# Patient Record
Sex: Male | Born: 1975 | Race: Black or African American | Hispanic: No | Marital: Single | Smoking: Current every day smoker
Health system: Southern US, Community
[De-identification: ages and names within clinical notes are randomized; demographics above are authoritative.]

---

## 2015-01-31 ENCOUNTER — Emergency Department (HOSPITAL_BASED_OUTPATIENT_CLINIC_OR_DEPARTMENT_OTHER): Payer: Self-pay

## 2015-01-31 ENCOUNTER — Emergency Department (HOSPITAL_BASED_OUTPATIENT_CLINIC_OR_DEPARTMENT_OTHER)
Admission: EM | Admit: 2015-01-31 | Discharge: 2015-01-31 | Disposition: A | Discharge status: Home / Self Care | Payer: Self-pay | Attending: Emergency Medicine | Admitting: Emergency Medicine

## 2015-01-31 ENCOUNTER — Encounter (HOSPITAL_BASED_OUTPATIENT_CLINIC_OR_DEPARTMENT_OTHER): Payer: Self-pay | Admitting: *Deleted

## 2015-01-31 DIAGNOSIS — M79641 Pain in right hand: Secondary | ICD-10-CM

## 2015-01-31 DIAGNOSIS — Z720 Tobacco use: Secondary | ICD-10-CM | POA: Insufficient documentation

## 2015-01-31 DIAGNOSIS — S6991XA Unspecified injury of right wrist, hand and finger(s), initial encounter: Secondary | ICD-10-CM | POA: Insufficient documentation

## 2015-01-31 DIAGNOSIS — Y9389 Activity, other specified: Secondary | ICD-10-CM | POA: Insufficient documentation

## 2015-01-31 DIAGNOSIS — W108XXA Fall (on) (from) other stairs and steps, initial encounter: Secondary | ICD-10-CM | POA: Insufficient documentation

## 2015-01-31 DIAGNOSIS — Y9289 Other specified places as the place of occurrence of the external cause: Secondary | ICD-10-CM | POA: Insufficient documentation

## 2015-01-31 DIAGNOSIS — Y998 Other external cause status: Secondary | ICD-10-CM | POA: Insufficient documentation

## 2015-01-31 MED ORDER — TRAMADOL HCL 50 MG PO TABS: 50.0000 mg | ORAL_TABLET | Freq: Four times a day (QID) | ORAL | Status: AC | PRN

## 2015-01-31 MED ORDER — HYDROCODONE-ACETAMINOPHEN 5-325 MG PO TABS
1.0000 | ORAL_TABLET | Freq: Once | ORAL | Status: AC
  Administered 2015-01-31: 1 via ORAL
  Filled 2015-01-31: qty 1

## 2015-01-31 NOTE — ED Provider Notes (Signed)
CSN: [id number]     Arrival date & time 01/31/15  1341 History   First MD Initiated Contact with Patient 01/31/15 1412     Chief Complaint  Patient presents with  . Fall  . Arm Injury   He fell forward this morning on his outstretched right hand.  He denies any LOC and tripped over his sandal.  Reports pain 10/10 that is achy, throbbing and localized. He denies any prior injury. Having some decreased sensation in the tips of his fingers in the right hand. Is unable to move his wrist in flexion or extension 2/2 to pain.    (Consider location/radiation/quality/duration/timing/severity/associated sxs/prior Treatment) The history is provided by the patient.    History reviewed. No pertinent past medical history. History reviewed. No pertinent past surgical history. No family history on file. History  Substance Use Topics  . Smoking status: Current Every Day Smoker -- 0.50 packs/day    Types: Cigarettes  . Smokeless tobacco: Not on file  . Alcohol Use: No    Review of Systems  Skin: Negative for rash and wound.      Allergies  Review of patient's allergies indicates no known allergies.  Home Medications   Prior to Admission medications   Not on File   BP 123/63 mmHg  Pulse 81  Temp(Src) 98.3 F (36.8 C) (Oral)  Resp 20  Ht 5\' 9"  (1.753 m)  Wt 175 lb (79.379 kg)  BMI 25.83 kg/m2  SpO2 99% Physical Exam  Constitutional: He appears well-developed and well-nourished.  HENT:  Head: Normocephalic and atraumatic.  Eyes: Conjunctivae and EOM are normal.  Neck: Normal range of motion.  Cardiovascular: Normal rate.   Pulmonary/Chest: Effort normal.  Musculoskeletal:  Right hand: no ecchymosis or erythema Swollen compared to left Generalized TTP on dorsal aspect of wrist  Limited ROM in wrist flexion and extension  Unable to test radial or ulnar deviation  Limited movement of digits 2/2 to pain  Thumb opposition intact  Sensation intact in distal digits.   Neurovascularly intact  No pain upon palpation of his right elbow.  Unable to supinate 2/2 to pain     ED Course  Procedures (including critical care time) Labs Review Labs Reviewed - No data to display  SPLINT APPLICATION Date/Time: 4:03 PM Authorized by: Clare GandyJeremy Schmitz Consent: Verbal consent obtained. Risks and benefits: risks, benefits and alternatives were discussed Consent given by: patient Splint applied by: orthopedic technician Location details: lateral right wrist Splint type: short arm  Supplies used: velcro Post-procedure: The splinted body part was neurovascularly unchanged following the procedure. Patient tolerance: Patient tolerated the procedure well with no immediate complications.   Imaging Review Dg Hand 2 View Left  01/31/2015   CLINICAL DATA:  Left hand for comparison view.  Right  EXAM: LEFT HAND - 2 VIEW  COMPARISON:  None.  FINDINGS: There is no evidence of fracture or dislocation. There is no evidence of arthropathy or other focal bone abnormality. Soft tissues are unremarkable.  IMPRESSION: No acute abnormality is noted. The broadened appearance of right first metacarpal is again likely posttraumatic in nature. No acute abnormality is noted on the left.   Electronically Signed   By: Alcide CleverMark  Lukens M.D.   On: 01/31/2015 15:25   Dg Hand Complete Right  01/31/2015   CLINICAL DATA:  Fall down steps 1 hour ago with right hand pain and swelling. Initial encounter.  EXAM: RIGHT HAND - COMPLETE 3+ VIEW  COMPARISON:  None.  FINDINGS: No evidence of  acute fracture or dislocation.  Broad appearance of the proximal first metacarpal is likely from remote and healed fracture.  Degenerative spurring at the distal pole of the scaphoid which could be posttraumatic given the above.  IMPRESSION: No acute osseous findings.   Electronically Signed   By: Marnee Spring M.D.   On: 01/31/2015 14:07     EKG Interpretation None     Medications  HYDROcodone-acetaminophen  (NORCO/VICODIN) 5-325 MG per tablet 1 tablet (1 tablet Oral Given 01/31/15 1458)    MDM   Final diagnoses:  Right hand pain   Pain 2/2 to fall. Imaging to reveal of any fracture. Suspect that edema is 2/2 to his trauma. May have TFCC injury but cannot based this on his imaging. Will place in a short arm splint. Script of tramadol for pain. Given information about follow up with hand specialist. Discharge patient and is agreeable.   Myra Rude, MD PGY-2, [facility] 01/31/2015, 3:54 PM      Myra Rude, MD 01/31/15 1610  Toy Cookey, MD 02/01/15 [phone]

## 2015-01-31 NOTE — Discharge Instructions (Signed)
RICE: Routine Care for Injuries The routine care of many injuries includes Rest, Ice, Compression, and Elevation (RICE). HOME CARE INSTRUCTIONS  Rest is needed to allow your body to heal. Routine activities can usually be resumed when comfortable. Injured tendons and bones can take up to 6 weeks to heal. Tendons are the cord-like structures that attach muscle to bone.  Ice following an injury helps keep the swelling down and reduces pain.  Put ice in a plastic bag.  Place a towel between your skin and the bag.  Leave the ice on for 15-20 minutes, 3-4 times a day, or as directed by your health care provider. Do this while awake, for the first 24 to 48 hours. After that, continue as directed by your caregiver.  Compression helps keep swelling down. It also gives support and helps with discomfort. If an elastic bandage has been applied, it should be removed and reapplied every 3 to 4 hours. It should not be applied tightly, but firmly enough to keep swelling down. Watch fingers or toes for swelling, bluish discoloration, coldness, numbness, or excessive pain. If any of these problems occur, remove the bandage and reapply loosely. Contact your caregiver if these problems continue.  Elevation helps reduce swelling and decreases pain. With extremities, such as the arms, hands, legs, and feet, the injured area should be placed near or above the level of the heart, if possible. SEEK IMMEDIATE MEDICAL CARE IF:  You have persistent pain and swelling.  You develop redness, numbness, or unexpected weakness.  Your symptoms are getting worse rather than improving after several days. These symptoms may indicate that further evaluation or further X-rays are needed. Sometimes, X-rays may not show a small broken bone (fracture) until 1 week or 10 days later. Make a follow-up appointment with your caregiver. Ask when your X-ray results will be ready. Make sure you get your X-ray results. Document Released:  11/25/2000 Document Revised: 08/18/2013 Document Reviewed: 01/12/2011 ExitCare Patient Information 2015 [employer]. This information is not intended to replace advice given to you by your health care provider. Make sure you discuss any questions you have with your health care provider.  

## 2015-01-31 NOTE — ED Notes (Signed)
Fell down 4 steps. Injury to his right hand. Swelling to his thumb.

## 2016-05-19 IMAGING — CR DG HAND COMPLETE 3+V*R*
3 series · 3 of 3 positions shown · non-contrast
Comparison: None.

CLINICAL DATA: Fall down steps 1 hour ago with right hand pain and
swelling. Initial encounter.

EXAM:
RIGHT HAND - COMPLETE 3+ VIEW

[x hand pa right]
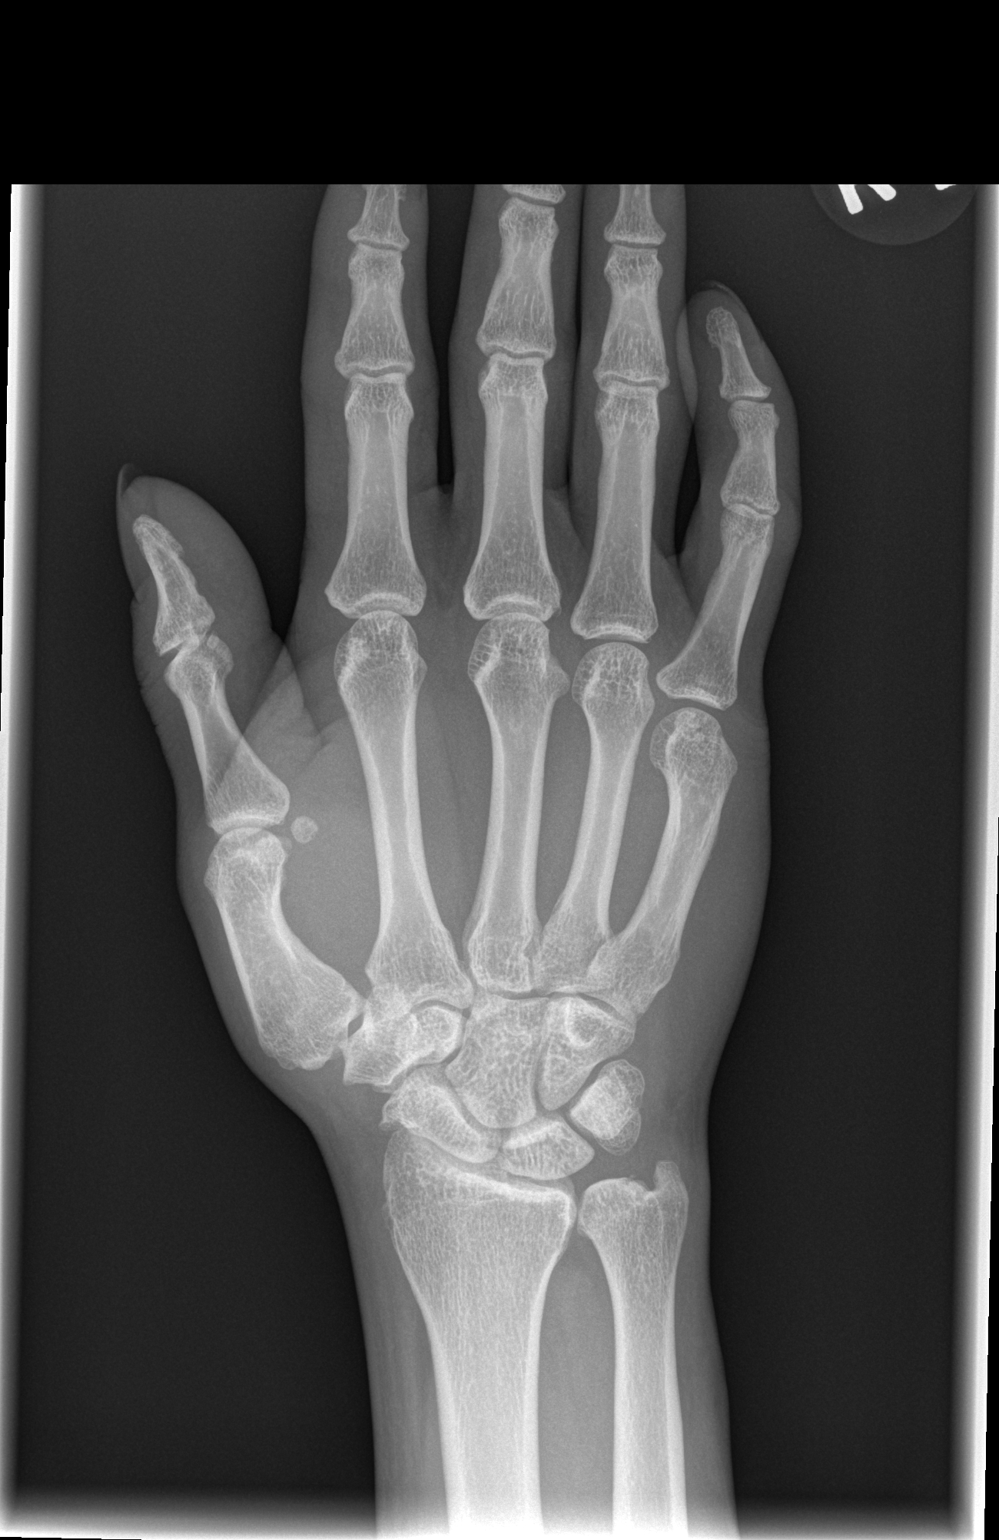

[x hand oblique right]
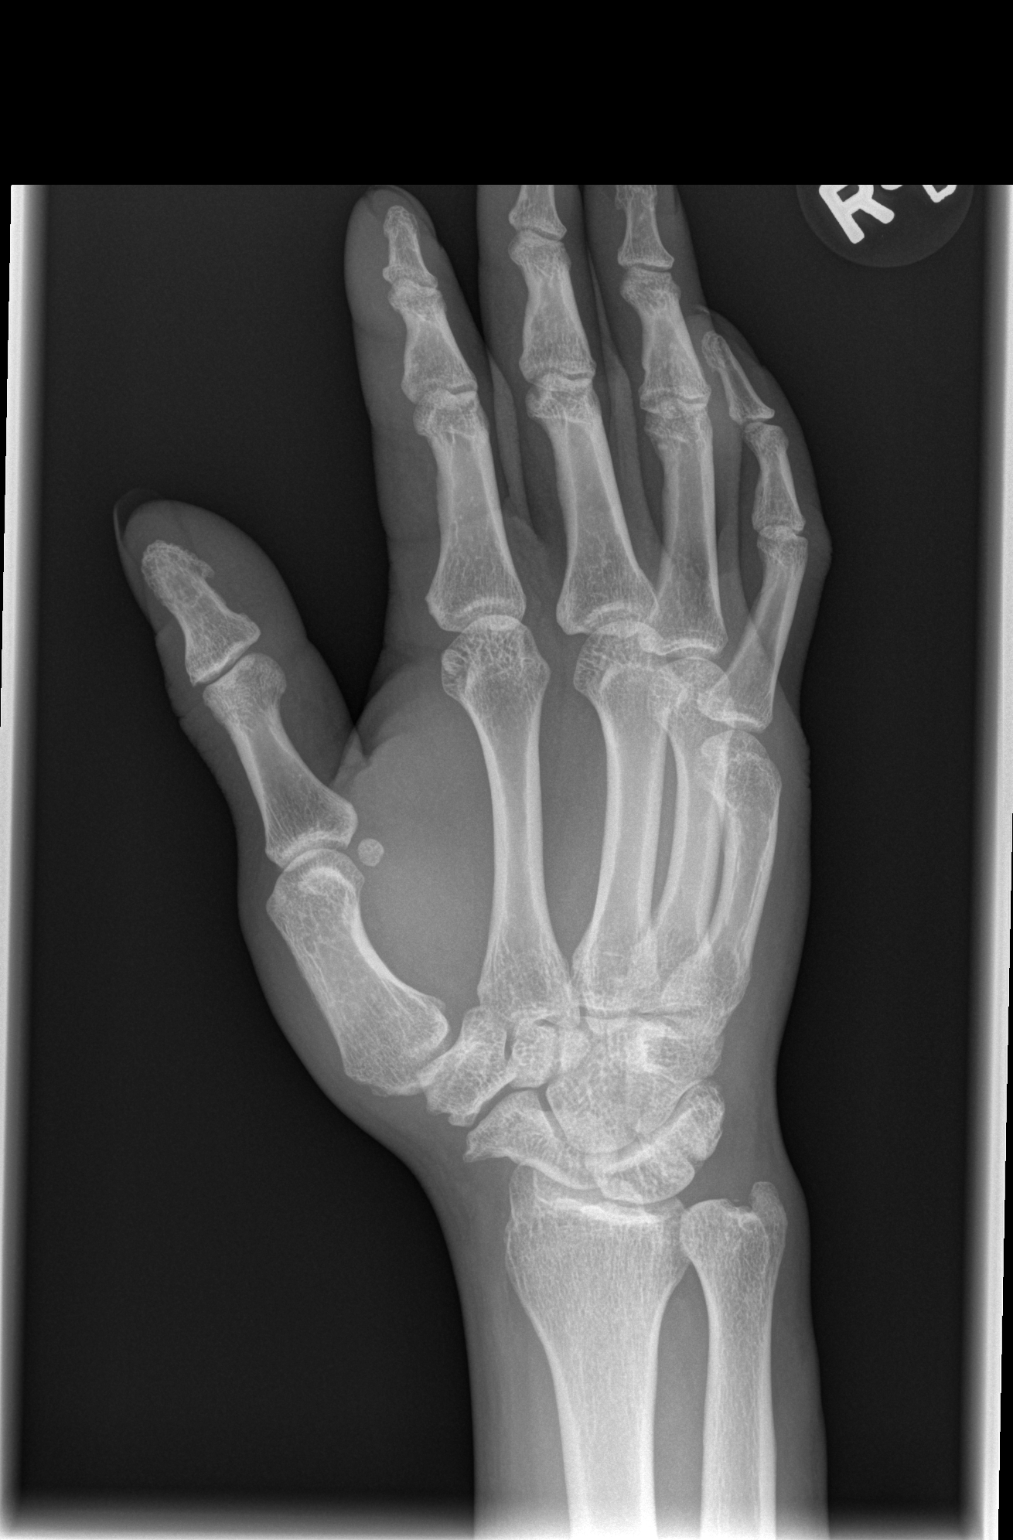

[x hand lat right]
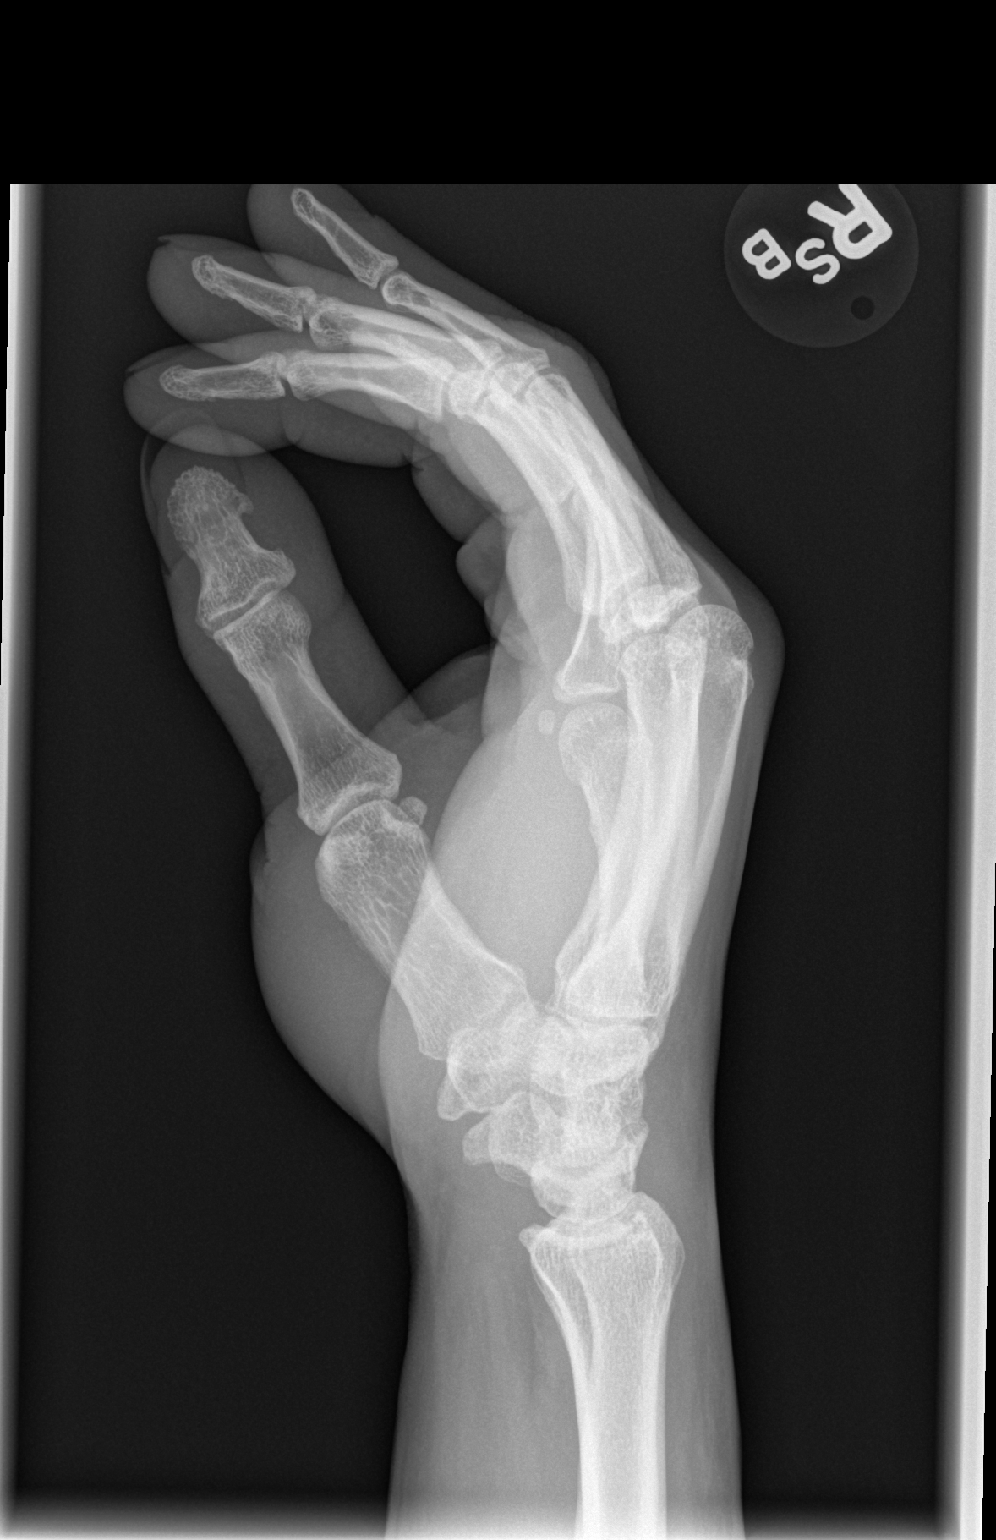

[3 of 3 positions shown; findings below may reference images not displayed]

FINDINGS: No evidence of acute fracture or dislocation.

Broad appearance of the proximal first metacarpal is likely from
remote and healed fracture.

Degenerative spurring at the distal pole of the scaphoid which could
be posttraumatic given the above.
IMPRESSION: No acute osseous findings.

## 2021-01-04 ENCOUNTER — Emergency Department (HOSPITAL_BASED_OUTPATIENT_CLINIC_OR_DEPARTMENT_OTHER)
Admission: EM | Admit: 2021-01-04 | Discharge: 2021-01-04 | Disposition: A | Discharge status: Home / Self Care | Payer: Self-pay | Attending: Emergency Medicine | Admitting: Emergency Medicine

## 2021-01-04 ENCOUNTER — Other Ambulatory Visit: Payer: Self-pay

## 2021-01-04 ENCOUNTER — Encounter (HOSPITAL_BASED_OUTPATIENT_CLINIC_OR_DEPARTMENT_OTHER): Payer: Self-pay

## 2021-01-04 ENCOUNTER — Emergency Department (HOSPITAL_BASED_OUTPATIENT_CLINIC_OR_DEPARTMENT_OTHER): Payer: Self-pay

## 2021-01-04 DIAGNOSIS — U071 COVID-19: Secondary | ICD-10-CM | POA: Insufficient documentation

## 2021-01-04 DIAGNOSIS — F17210 Nicotine dependence, cigarettes, uncomplicated: Secondary | ICD-10-CM | POA: Insufficient documentation

## 2021-01-04 LAB — RESP PANEL BY RT-PCR (FLU A&B, COVID) ARPGX2
Influenza A by PCR: NEGATIVE
Influenza B by PCR: NEGATIVE
SARS Coronavirus 2 by RT PCR: POSITIVE — AB

## 2021-01-04 MED ORDER — ACETAMINOPHEN 500 MG PO TABS
1000.0000 mg | ORAL_TABLET | Freq: Once | ORAL | Status: AC
  Administered 2021-01-04: 1000 mg via ORAL
  Filled 2021-01-04: qty 2

## 2021-01-04 MED ORDER — IBUPROFEN 800 MG PO TABS
800.0000 mg | ORAL_TABLET | Freq: Once | ORAL | Status: AC
  Administered 2021-01-04: 800 mg via ORAL
  Filled 2021-01-04: qty 1

## 2021-01-04 MED ORDER — BENZONATATE 100 MG PO CAPS: 100.0000 mg | ORAL_CAPSULE | Freq: Three times a day (TID) | ORAL | 0 refills | Status: AC | PRN

## 2021-01-04 MED ORDER — FLUTICASONE PROPIONATE 50 MCG/ACT NA SUSP: 2.0000 | Freq: Every day | NASAL | 0 refills | Status: AC

## 2021-01-04 MED ORDER — ALBUTEROL SULFATE HFA 108 (90 BASE) MCG/ACT IN AERS: 1.0000 | INHALATION_SPRAY | Freq: Four times a day (QID) | RESPIRATORY_TRACT | 0 refills | Status: AC | PRN

## 2021-01-04 NOTE — ED Provider Notes (Signed)
Emergency Department Provider Note   I have reviewed the triage vital signs and the nursing notes.   HISTORY  Chief Complaint Cough   HPI Marcus Mccoy is a 45 y.o. male with past medical history reviewed below presents with headache, body aches, cough, sore throat, loss of smell.  Symptoms have been present since yesterday afternoon.  He is currently staying in a homeless shelter and states its not uncommon for others at the shelter to have various infection type symptoms.  No known COVID infection/contact.  He is not vaccinated.  He is not having chest pain.  He is not having swelling in the legs.  No radiation of symptoms or other modifying factors.   History reviewed. No pertinent past medical history.  There are no problems to display for this patient.   History reviewed. No pertinent surgical history.  Allergies Patient has no known allergies.  No family history on file.  Social History Social History   Tobacco Use  . Smoking status: Current Every Day Smoker    Packs/day: 0.50    Types: Cigarettes  . Smokeless tobacco: Never Used  Substance Use Topics  . Alcohol use: No  . Drug use: No    Review of Systems  Constitutional: Subjective fever and body aches.  Eyes: No visual changes. ENT: Positive sore throat. Cardiovascular: Denies chest pain. Respiratory: Denies shortness of breath. Positive cough.  Gastrointestinal: No abdominal pain.  No nausea, no vomiting.  No diarrhea.  No constipation. Genitourinary: Negative for dysuria. Musculoskeletal: Negative for back pain. Skin: Negative for rash. Neurological: Negative for focal weakness or numbness. Positive HA.   10-point ROS otherwise negative.  ____________________________________________   PHYSICAL EXAM:  VITAL SIGNS: ED Triage Vitals  Enc Vitals Group     BP 01/04/21 1221 (!) 151/97     Pulse Rate 01/04/21 1221 (!) 116     Resp 01/04/21 1221 20     Temp 01/04/21 1221 (!) 102.7 F (39.3  C)     Temp Source 01/04/21 1221 Oral     SpO2 01/04/21 1221 98 %     Weight 01/04/21 1221 186 lb (84.4 kg)     Height 01/04/21 1221 5\' 8"  (1.727 m)   Constitutional: Alert and oriented. Well appearing and in no acute distress. Eyes: Conjunctivae are normal.  Head: Atraumatic. Nose: Mild congestion/rhinnorhea. Mouth/Throat: Mucous membranes are moist.  Oropharynx with mild erythema. No PTA. No exudate. Uvula is midline.  Neck: No stridor.  No meningeal signs. Cardiovascular: Tachycardia. Good peripheral circulation. Grossly normal heart sounds.   Respiratory: Normal respiratory effort.  No retractions. Lungs CTAB. Gastrointestinal: Soft and nontender. No distention.  Musculoskeletal:  No gross deformities of extremities. Neurologic:  Normal speech and language.  Skin:  Skin is warm, dry and intact. No rash noted.   ____________________________________________   LABS (all labs ordered are listed, but only abnormal results are displayed)  Labs Reviewed  RESP PANEL BY RT-PCR (FLU A&B, COVID) ARPGX2 - Abnormal; Notable for the following components:      Result Value   SARS Coronavirus 2 by RT PCR POSITIVE (*)    All other components within normal limits   ____________________________________________  RADIOLOGY  DG Chest Portable 1 View  Result Date: 01/04/2021 CLINICAL DATA:  Cough and fevers for 2 days EXAM: PORTABLE CHEST 1 VIEW COMPARISON:  07/26/2017 FINDINGS: Cardiac shadow is stable. The lungs are well aerated bilaterally. Mild left basilar atelectasis is seen. No bony abnormality is noted. IMPRESSION: Mild left basilar  atelectasis. Electronically Signed   By: Alcide Clever M.D.   On: 01/04/2021 15:40    ____________________________________________   PROCEDURES  Procedure(s) performed:   Procedures  None  ____________________________________________   INITIAL IMPRESSION / ASSESSMENT AND PLAN / ED COURSE  Pertinent labs & imaging results that were available  during my care of the patient were reviewed by me and considered in my medical decision making (see chart for details).   Patient presents to the emergency department with flulike symptoms starting yesterday.  He is not having chest pain, shortness of breath.  He is having cough with body aches mainly.  He is not hypoxic, tachypneic, tachycardic.  Doubt PE.  Abdomen is soft and nontender.  No SIRS vitals. Plan for COVID/Flu swab and CXR.  CXR clear. Flu negative but COVID is positive. Do not see an indication for admit. Will discuss with SW regarding community resources with patient's homeless status.   06:05 PM  Called back to patient's room.  He states that he needs to leave.  We discussed his COVID-positive test results and I suspect he would not be invited back to the shelter in this situation.  He states that he does work and will need a work note.  He can call friends/family to see if he may be able to stay with them.  I have a consult out to the social worker asking for assistance.  He understands this but would not like to wait for the return call and states he needs to go now.  We discussed isolation along with return precautions.  Provided supportive care medications here.   Marcus Mccoy was evaluated in Emergency Department on 01/04/2021 for the symptoms described in the history of present illness. He was evaluated in the context of the global COVID-19 pandemic, which necessitated consideration that the patient might be at risk for infection with the SARS-CoV-2 virus that causes COVID-19. Institutional protocols and algorithms that pertain to the evaluation of patients at risk for COVID-19 are in a state of rapid change based on information released by regulatory bodies including the CDC and federal and state organizations. These policies and algorithms were followed during the patient's care in the ED.  ____________________________________________  FINAL CLINICAL IMPRESSION(S) / ED  DIAGNOSES  Final diagnoses:  COVID-19     MEDICATIONS GIVEN DURING THIS VISIT:  Medications  ibuprofen (ADVIL) tablet 800 mg (800 mg Oral Given 01/04/21 1226)  acetaminophen (TYLENOL) tablet 1,000 mg (1,000 mg Oral Given 01/04/21 1747)     NEW OUTPATIENT MEDICATIONS STARTED DURING THIS VISIT:  New Prescriptions   ALBUTEROL (VENTOLIN HFA) 108 (90 BASE) MCG/ACT INHALER    Inhale 1-2 puffs into the lungs every 6 (six) hours as needed for wheezing or shortness of breath.   BENZONATATE (TESSALON) 100 MG CAPSULE    Take 1 capsule (100 mg total) by mouth 3 (three) times daily as needed for cough.   FLUTICASONE (FLONASE) 50 MCG/ACT NASAL SPRAY    Place 2 sprays into both nostrils daily for 7 days.    Note:  This document was prepared using Dragon voice recognition software and may include unintentional dictation errors.  Alona Bene, MD, [facility] Emergency Medicine    Kennis Wissmann, Arlyss Repress, MD 01/04/21 [phone]

## 2021-01-04 NOTE — Discharge Instructions (Signed)
You were seen in the emergency department today with flulike symptoms.  Your test for COVID has come back positive.  I have provided several prescriptions to help with symptoms.  If you develop shortness of breath, chest pain, confusion you should return to the emergency department.  You can alternate Tylenol and Motrin for fever and headache/body aches.

## 2021-01-04 NOTE — ED Triage Notes (Signed)
Pt c/o flu like sx started yesterday-NAD-steady gait 

## 2021-01-05 NOTE — Progress Notes (Signed)
01/05/2021 248 pm    [facility] Medication Assistance Card  Name: Marcus Mccoy  ID (MRN): [id number] Bin: [id number] RX Group: [id number]  Discharge Date:01/04/2021  6:14 PM  Expiration Date:01/13/2021  (must be filled within 7 days of discharge)     Dear Marcus Mccoy:  You have been approved to have the prescriptions written by your discharging physician filled through our [facility] (Medication Assistance Through [facility]) program. This program allows for a one-time (no refills) 34-day supply of selected medications for a low copay amount.  The copay is $3.00 per prescription. For instance, if you have one prescription, you will pay $3.00; for two prescriptions, you pay $6.00; for three prescriptions, you pay $9.00; and so on.  Only certain pharmacies are participating in this program with [facility]. You will need to select one of the pharmacies from the attached list and take your prescriptions, this letter, and your photo ID to one of the participating pharmacies.   We are excited that you are able to use the [facility] program to get your medications. These prescriptions must be filled within 7 days of hospital discharge or they will no longer be valid for the [facility] program. Should you have any problems with your prescriptions please contact your case management team member at [phone] for [place].   Thank you,    [facility]

## 2021-01-05 NOTE — Progress Notes (Addendum)
01/05/2021 3:08 pm Contacted pt and states he does not have finances for meds. [facility] not [facility]. Transferred meds to [facility], used MATCH with $0 copay as pt is homeless. Pt did call [employer] and waiting on call back. Pt to follow up for [facility] or COVID clinic. Isidoro Donning RN CCM, [facility] ED TOC [facility] [phone]  01/05/2021 2:21 pm Spoke to pt and states he staying with brother for one night. States he was living in homeless shelter and diagnosed with COVID. He was put out of shelter. Contacted [employer] for homeless. States the contract has ended but they are referring pt to [employer], [facility]. Pt [facility] [address], [place]. Isidoro Donning RN CCM, [facility] ED TOC [facility] [place] [phone]  01/05/2021 2:14 pm Attempted call to listed number and mother provided me with his number # [phone]. Isidoro Donning RN CCM, [facility] ED TOC CM [phone]

## 2021-01-10 ENCOUNTER — Emergency Department (HOSPITAL_BASED_OUTPATIENT_CLINIC_OR_DEPARTMENT_OTHER): Payer: Self-pay

## 2021-01-10 ENCOUNTER — Emergency Department (HOSPITAL_BASED_OUTPATIENT_CLINIC_OR_DEPARTMENT_OTHER)
Admission: EM | Admit: 2021-01-10 | Discharge: 2021-01-10 | Disposition: A | Discharge status: Home / Self Care | Payer: Self-pay | Attending: Emergency Medicine | Admitting: Emergency Medicine

## 2021-01-10 ENCOUNTER — Encounter (HOSPITAL_BASED_OUTPATIENT_CLINIC_OR_DEPARTMENT_OTHER): Payer: Self-pay | Admitting: *Deleted

## 2021-01-10 ENCOUNTER — Other Ambulatory Visit: Payer: Self-pay

## 2021-01-10 DIAGNOSIS — Z28310 Unvaccinated for covid-19: Secondary | ICD-10-CM | POA: Insufficient documentation

## 2021-01-10 DIAGNOSIS — U071 COVID-19: Secondary | ICD-10-CM | POA: Insufficient documentation

## 2021-01-10 DIAGNOSIS — F17210 Nicotine dependence, cigarettes, uncomplicated: Secondary | ICD-10-CM | POA: Insufficient documentation

## 2021-01-10 MED ORDER — PROMETHAZINE-DM 6.25-15 MG/5ML PO SYRP: 5.0000 mL | ORAL_SOLUTION | Freq: Four times a day (QID) | ORAL | 0 refills | Status: AC | PRN

## 2021-01-10 NOTE — ED Provider Notes (Signed)
[facility] EMERGENCY DEPARTMENT Provider Note   CSN: [id number] Arrival date & time: 01/10/21  1149     History Chief Complaint  Patient presents with  . Covid Positive  . Cough    Marcus Mccoy is a 45 y.o. male.  45 year old male presents with complaint of worsening cough, headaches, body aches, chest sore with coughing.  Patient was COVID-positive 6 days ago, given prescription for albuterol and Tessalon which has been using and felt like he was improving until last night when he now feels much worse.  Patient is a daily smoker, states has not been able to smoke lately because he cannot taste anything.  Denies diarrhea, vomiting.  Not vaccinated, no other complaints or concerns.   Marcus Mccoy was evaluated in Emergency Department on 01/10/2021 for the symptoms described in the history of present illness. He was evaluated in the context of the global COVID-19 pandemic, which necessitated consideration that the patient might be at risk for infection with the SARS-CoV-2 virus that causes COVID-19. Institutional protocols and algorithms that pertain to the evaluation of patients at risk for COVID-19 are in a state of rapid change based on information released by regulatory bodies including the CDC and federal and state organizations. These policies and algorithms were followed during the patient's care in the ED.         History reviewed. No pertinent past medical history.  There are no problems to display for this patient.   History reviewed. No pertinent surgical history.     No family history on file.  Social History   Tobacco Use  . Smoking status: Current Every Day Smoker    Packs/day: 0.50    Types: Cigarettes  . Smokeless tobacco: Never Used  Substance Use Topics  . Alcohol use: No  . Drug use: No    Home Medications Prior to Admission medications   Medication Sig Start Date End Date Taking? Authorizing Provider   promethazine-dextromethorphan (PROMETHAZINE-DM) 6.25-15 MG/5ML syrup Take 5 mLs by mouth 4 (four) times daily as needed for cough. 01/10/21  Yes Jeannie Fend, PA-C  albuterol (VENTOLIN HFA) 108 (90 Base) MCG/ACT inhaler Inhale 1-2 puffs into the lungs every 6 (six) hours as needed for wheezing or shortness of breath. 01/04/21   Long, Arlyss Repress, MD  benzonatate (TESSALON) 100 MG capsule Take 1 capsule (100 mg total) by mouth 3 (three) times daily as needed for cough. 01/04/21   Long, Arlyss Repress, MD  fluticasone (FLONASE) 50 MCG/ACT nasal spray Place 2 sprays into both nostrils daily for 7 days. 01/04/21 01/11/21  Long, Arlyss Repress, MD  traMADol (ULTRAM) 50 MG tablet Take 1 tablet (50 mg total) by mouth every 6 (six) hours as needed. 01/31/15   Myra Rude, MD    Allergies    Patient has no known allergies.  Review of Systems   Review of Systems  Constitutional: Negative for chills and fever.  HENT: Positive for congestion.   Eyes: Negative for discharge and redness.  Respiratory: Positive for cough and shortness of breath.   Cardiovascular: Negative for chest pain.  Gastrointestinal: Negative for abdominal pain, constipation, diarrhea, nausea and vomiting.  Genitourinary: Negative for dysuria.  Musculoskeletal: Positive for arthralgias and myalgias.  Skin: Negative for rash and wound.  Allergic/Immunologic: Negative for immunocompromised state.  Neurological: Negative for weakness.  Hematological: Negative for adenopathy.  Psychiatric/Behavioral: Negative for confusion.  All other systems reviewed and are negative.   Physical Exam Updated Vital Signs BP 130/84 (BP  Location: Left Arm)   Pulse 86   Temp 98.4 F (36.9 C) (Oral)   Resp 20   Ht 5\' 8"  (1.727 m)   Wt 84.4 kg   SpO2 99%   BMI 28.29 kg/m   Physical Exam Vitals and nursing note reviewed.  Constitutional:      General: He is not in acute distress.    Appearance: He is well-developed. He is not diaphoretic.  HENT:      Head: Normocephalic and atraumatic.  Eyes:     Conjunctiva/sclera: Conjunctivae normal.  Cardiovascular:     Rate and Rhythm: Normal rate and regular rhythm.     Pulses: Normal pulses.     Heart sounds: Normal heart sounds.  Pulmonary:     Effort: Pulmonary effort is normal.     Breath sounds: Normal breath sounds.  Abdominal:     Palpations: Abdomen is soft.     Tenderness: There is no abdominal tenderness.  Musculoskeletal:     Cervical back: Neck supple.     Right lower leg: No edema.     Left lower leg: No edema.  Lymphadenopathy:     Cervical: No cervical adenopathy.  Skin:    General: Skin is warm and dry.     Findings: No erythema or rash.  Neurological:     Mental Status: He is alert and oriented to person, place, and time.  Psychiatric:        Behavior: Behavior normal.     ED Results / Procedures / Treatments   Labs (all labs ordered are listed, but only abnormal results are displayed) Labs Reviewed - No data to display  EKG None  Radiology DG Chest [facility] 1 View  Result Date: 01/10/2021 CLINICAL DATA:  Cough.  COVID-19 positive EXAM: PORTABLE CHEST 1 VIEW COMPARISON:  Jan 04, 2021 FINDINGS: Lungs are clear. Heart is upper normal in size with pulmonary vascularity normal. No adenopathy. No bone lesions. IMPRESSION: Lungs clear.  Heart upper normal in size. Electronically Signed   By: Jan 06, 2021 III M.D.   On: 01/10/2021 14:19    Procedures Procedures   Medications Ordered in ED Medications - No data to display  ED Course  I have reviewed the triage vital signs and the nursing notes.  Pertinent labs & imaging results that were available during my care of the patient were reviewed by me and considered in my medical decision making (see chart for details).  Clinical Course as of 01/10/21 1429  Tue Jan 10, 2021  23 45 year old male with COVID-positive test 6 days ago returns with ongoing symptoms, feels like he is worse today.  Lungs are clear to  auscultation.  Vitals are reassuring including O2 sat of 99% on room air.  Chest x-ray is negative for pneumonia or other acute process.  Patient is given a prescription for Phenergan DM for symptom management and advised to follow-up with primary care. [LM]    Clinical Course User Index [LM] 54   MDM Rules/Calculators/A&P                          Final Clinical Impression(s) / ED Diagnoses Final diagnoses:  COVID    Rx / DC Orders ED Discharge Orders         Ordered    promethazine-dextromethorphan (PROMETHAZINE-DM) 6.25-15 MG/5ML syrup  4 times daily PRN        01/10/21 1428  Jeannie Fend, PA-C 01/10/21 1429    Terald Sleeper, MD 01/10/21 [phone]

## 2021-01-10 NOTE — ED Triage Notes (Signed)
Covid positive 6 days ago. Here today with headache, cough and chest soreness. When he blows his nose returns are bloody.

## 2021-01-10 NOTE — Discharge Instructions (Addendum)
Your chest x-ray today does not show pneumonia.  Your vitals are reassuring. I have sent a new prescription for cough syrup to your pharmacy that will hopefully help with your symptoms.  Recheck with your primary care provider or referral as given per last visit.
